# Patient Record
Sex: Male | Born: 1985 | Race: White | Hispanic: No | Marital: Married | Smoking: Current every day smoker
Health system: Southern US, Community
[De-identification: ages and names within clinical notes are randomized; demographics above are authoritative.]

## PROBLEM LIST (undated history)

## (undated) DIAGNOSIS — T7840XA Allergy, unspecified, initial encounter: Secondary | ICD-10-CM

## (undated) DIAGNOSIS — J45909 Unspecified asthma, uncomplicated: Secondary | ICD-10-CM

## (undated) HISTORY — PX: TONSILLECTOMY: SUR1361

## (undated) HISTORY — DX: Unspecified asthma, uncomplicated: J45.909

## (undated) HISTORY — DX: Allergy, unspecified, initial encounter: T78.40XA

---
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
## 2004-04-12 ENCOUNTER — Ambulatory Visit: Payer: Self-pay | Admitting: Psychiatry

## 2004-04-12 ENCOUNTER — Other Ambulatory Visit (HOSPITAL_COMMUNITY): Admission: RE | Admit: 2004-04-12 | Discharge: 2004-07-11 | Payer: Self-pay | Admitting: Psychiatry

## 2007-02-23 ENCOUNTER — Emergency Department (HOSPITAL_COMMUNITY): Admission: EM | Admit: 2007-02-23 | Discharge: 2007-02-23 | Payer: Self-pay | Admitting: Emergency Medicine

## 2015-04-19 ENCOUNTER — Ambulatory Visit (INDEPENDENT_AMBULATORY_CARE_PROVIDER_SITE_OTHER): Payer: Self-pay | Admitting: Physician Assistant

## 2015-04-19 VITALS — BP 118/68 | HR 86 | Temp 98.2°F | Resp 18 | Ht 68.0 in | Wt 168.0 lb

## 2015-04-19 DIAGNOSIS — M546 Pain in thoracic spine: Secondary | ICD-10-CM

## 2015-04-19 DIAGNOSIS — M62830 Muscle spasm of back: Secondary | ICD-10-CM

## 2015-04-19 MED ORDER — MELOXICAM 7.5 MG PO TABS: 7.5000 mg | ORAL_TABLET | Freq: Every day | ORAL | Status: DC

## 2015-04-19 MED ORDER — CYCLOBENZAPRINE HCL 5 MG PO TABS: 5.0000 mg | ORAL_TABLET | Freq: Three times a day (TID) | ORAL | Status: DC | PRN

## 2015-04-19 NOTE — Patient Instructions (Signed)
Please take the flexeril every 8 hours as needed for the spasm. Please take the meloxicam daily for 2-3 weeks. Scheduling tylenol 650 mg every 6 hours will help with the pain.  Heat to the area for the next 24-48 hours then cycling between heat prior to activity and ice after activity will help.  Light massage to the area will help once you're feeling better.   Muscle Cramps and Spasms Muscle cramps and spasms occur when a muscle or muscles tighten and you have no control over this tightening (involuntary muscle contraction). They are a common problem and can develop in any muscle. The most common place is in the calf muscles of the leg. Both muscle cramps and muscle spasms are involuntary muscle contractions, but they also have differences:   Muscle cramps are sporadic and painful. They may last a few seconds to a quarter of an hour. Muscle cramps are often more forceful and last longer than muscle spasms.  Muscle spasms may or may not be painful. They may also last just a few seconds or much longer. CAUSES  It is uncommon for cramps or spasms to be due to a serious underlying problem. In many cases, the cause of cramps or spasms is unknown. Some common causes are:   Overexertion.   Overuse from repetitive motions (doing the same thing over and over).   Remaining in a certain position for a long period of time.   Improper preparation, form, or technique while performing a sport or activity.   Dehydration.   Injury.   Side effects of some medicines.   Abnormally low levels of the salts and ions in your blood (electrolytes), especially potassium and calcium. This could happen if you are taking water pills (diuretics) or you are pregnant.  Some underlying medical problems can make it more likely to develop cramps or spasms. These include, but are not limited to:   Diabetes.   Parkinson disease.   Hormone disorders, such as thyroid problems.   Alcohol abuse.    Diseases specific to muscles, joints, and bones.   Blood vessel disease where not enough blood is getting to the muscles.  HOME CARE INSTRUCTIONS   Stay well hydrated. Drink enough water and fluids to keep your urine clear or pale yellow.  It may be helpful to massage, stretch, and relax the affected muscle.  For tight or tense muscles, use a warm towel, heating pad, or hot shower water directed to the affected area.  If you are sore or have pain after a cramp or spasm, applying ice to the affected area may relieve discomfort.  Put ice in a plastic bag.  Place a towel between your skin and the bag.  Leave the ice on for 15-20 minutes, 03-04 times a day.  Medicines used to treat a known cause of cramps or spasms may help reduce their frequency or severity. Only take over-the-counter or prescription medicines as directed by your caregiver. SEEK MEDICAL CARE IF:  Your cramps or spasms get more severe, more frequent, or do not improve over time.  MAKE SURE YOU:   Understand these instructions.  Will watch your condition.  Will get help right away if you are not doing well or get worse. Document Released: 01/28/2002 Document Revised: 12/03/2012 Document Reviewed: 07/25/2012 [facility] Patient Information 2015 [place], [place]. This information is not intended to replace advice given to you by your health care provider. Make sure you discuss any questions you have with your health care provider.

## 2015-04-19 NOTE — Progress Notes (Signed)
   Subjective:    Patient ID: Lee Clayton, male    DOB: 1986-01-13, 29 y.o.   MRN: [id number]  Chief Complaint  Patient presents with  . Fall    back pain started yesterday    Medications, allergies, past medical history, surgical history, family history, social history and problem list reviewed and updated.  HPI  29 yom presents with left sided mid back pain.   Slipped and fell going down stairs 2 days ago. No back pain after this. Yest morning awoke fine and was working all day fine. Yest mid day leaned to the right to pick something up and felt sudden pain left mid back. Described as sharp. Has been mild and constant since onset, worse with certain movements at the waist. No radiation of pain.   Denies fevers, chills, IVD use, bowel/bladder dysfx, perianal los, leg weakness, numbness, pain, tingling.   Review of Systems See HPI     Objective:   Physical Exam  Constitutional: He appears well-developed and well-nourished.  Non-toxic appearance. He does not have a sickly appearance. He does not appear ill. No distress.  BP 118/68 mmHg  Pulse 86  Temp(Src) 98.2 F (36.8 C) (Oral)  Resp 18  Ht  (1.727 m)  Wt 168 lb (76.204 kg)  BMI 25.55 kg/m2  SpO2 98%   Musculoskeletal:       Thoracic back: He exhibits tenderness and spasm. He exhibits no bony tenderness.       Lumbar back: He exhibits no tenderness and no bony tenderness.       Back:  Pain, spasm over circled area. No overlying skin changes.   Neurological:  Unable to test SLR as pt did not want to lie or sit on bed as he thought he might have trouble getting up.       Assessment & Plan:   Left-sided thoracic back pain - Plan: meloxicam (MOBIC) 7.5 MG tablet  Muscle spasm of back - Plan: cyclobenzaprine (FLEXERIL) 5 MG tablet --no bony tenderness, no compression/cauda equina/abscess signs or sx --suspect spasm/strain left paraspinals -- mobic, flexeril, tylenol, heat, light massage --rtc 4-5 days if no  relief  Donnajean Lopes, PA-C Physician Assistant-Certified [facility]  04/19/2015 11:56 AM

## 2015-04-19 NOTE — Addendum Note (Signed)
Addended by: Eddie Candle on: 04/19/2015 12:02 PM   Modules accepted: Orders

## 2017-05-22 ENCOUNTER — Emergency Department (HOSPITAL_BASED_OUTPATIENT_CLINIC_OR_DEPARTMENT_OTHER)
Admission: EM | Admit: 2017-05-22 | Discharge: 2017-05-22 | Disposition: A | Discharge status: Home / Self Care | Payer: Self-pay | Attending: Emergency Medicine | Admitting: Emergency Medicine

## 2017-05-22 ENCOUNTER — Emergency Department (HOSPITAL_COMMUNITY)
Admission: EM | Admit: 2017-05-22 | Discharge: 2017-05-22 | Discharge status: Against Medical Advice | Payer: Self-pay | Attending: Emergency Medicine | Admitting: Emergency Medicine

## 2017-05-22 ENCOUNTER — Emergency Department (HOSPITAL_BASED_OUTPATIENT_CLINIC_OR_DEPARTMENT_OTHER): Payer: Self-pay

## 2017-05-22 ENCOUNTER — Encounter (HOSPITAL_COMMUNITY): Payer: Self-pay | Admitting: *Deleted

## 2017-05-22 ENCOUNTER — Encounter (HOSPITAL_BASED_OUTPATIENT_CLINIC_OR_DEPARTMENT_OTHER): Payer: Self-pay | Admitting: Emergency Medicine

## 2017-05-22 DIAGNOSIS — R319 Hematuria, unspecified: Secondary | ICD-10-CM | POA: Insufficient documentation

## 2017-05-22 DIAGNOSIS — Z5321 Procedure and treatment not carried out due to patient leaving prior to being seen by health care provider: Secondary | ICD-10-CM | POA: Insufficient documentation

## 2017-05-22 DIAGNOSIS — J45909 Unspecified asthma, uncomplicated: Secondary | ICD-10-CM | POA: Insufficient documentation

## 2017-05-22 DIAGNOSIS — N200 Calculus of kidney: Secondary | ICD-10-CM | POA: Insufficient documentation

## 2017-05-22 DIAGNOSIS — F17210 Nicotine dependence, cigarettes, uncomplicated: Secondary | ICD-10-CM | POA: Insufficient documentation

## 2017-05-22 LAB — URINALYSIS, MICROSCOPIC (REFLEX)

## 2017-05-22 LAB — URINALYSIS, ROUTINE W REFLEX MICROSCOPIC
BILIRUBIN URINE: NEGATIVE
Glucose, UA: NEGATIVE mg/dL
KETONES UR: NEGATIVE mg/dL
Ketones, ur: 15 mg/dL — AB
LEUKOCYTES UA: NEGATIVE
NITRITE: NEGATIVE
Nitrite: NEGATIVE
PROTEIN: NEGATIVE mg/dL
Protein, ur: 100 mg/dL — AB
Specific Gravity, Urine: 1.01 (ref 1.005–1.030)
Specific Gravity, Urine: 1.02 (ref 1.005–1.030)
pH: 8.5 — ABNORMAL HIGH (ref 5.0–8.0)
pH: 7 (ref 5.0–8.0)

## 2017-05-22 MED ORDER — TAMSULOSIN HCL 0.4 MG PO CAPS
0.4000 mg | ORAL_CAPSULE | Freq: Every day | ORAL | 0 refills | Status: AC
Start: 2017-05-22 — End: 2017-06-05

## 2017-05-22 MED ORDER — TRAMADOL HCL 50 MG PO TABS: 50.0000 mg | ORAL_TABLET | Freq: Four times a day (QID) | ORAL | 0 refills | Status: DC | PRN

## 2017-05-22 MED ORDER — IBUPROFEN 800 MG PO TABS: 800.0000 mg | ORAL_TABLET | Freq: Three times a day (TID) | ORAL | 0 refills | Status: DC | PRN

## 2017-05-22 NOTE — ED Triage Notes (Signed)
Patient report that he has acute pain this am to his lower back, reports that he has sat at [facility] long x 6 hours and now the pain is 0/10  - patient reports that his urine stream was less this am with blood in his urine. While sitting at [facility] long he reports that his urine stream got better and he urinated more.

## 2017-05-22 NOTE — ED Notes (Signed)
Patient did not come when called from lobby for vitals to be updated.

## 2017-05-22 NOTE — Discharge Instructions (Signed)
You have been seen in the Emergency Department (ED) today for pain that we believe based on your workup, is caused by kidney stones.  As we have discussed, please drink plenty of fluids.  Please make a follow up appointment with the physician(s) listed elsewhere in this documentation. ° °You may take pain medication as needed but ONLY as prescribed.  Please also take your prescribed Flomax daily.  We also recommend that you take over-the-counter ibuprofen regularly according to label instructions over the next 5 days.  Take it with meals to minimize stomach discomfort. ° °Please see your doctor as soon as possible as stones may take 1-3 weeks to pass and you may require additional care or medications. ° °Do not drink alcohol, drive or participate in any other potentially dangerous activities while taking opiate pain medication as it may make you sleepy. Do not take this medication with any other sedating medications, either prescription or over-the-counter. If you were prescribed Percocet or Vicodin, do not take these with acetaminophen (Tylenol) as it is already contained within these medications. °  °Take Tramadol as needed for severe pain.  This medication is an opiate (or narcotic) pain medication and can be habit forming.  Use it as little as possible to achieve adequate pain control.  Do not use or use it with extreme caution if you have a history of opiate abuse or dependence.  If you are on a pain contract with your primary care doctor or a pain specialist, be sure to let them know you were prescribed this medication today from the Emergency Department.  This medication is intended for your use only - do not give any to anyone else and keep it in a secure place where nobody else, especially children, have access to it.  It will also cause or worsen constipation, so you may want to consider taking an over-the-counter stool softener while you are taking this medication. ° °Return to the Emergency Department  (ED) or call your doctor if you have any worsening pain, fever, painful urination, are unable to urinate, or develop other symptoms that concern you. ° ° ° °Kidney Stones °Kidney stones (urolithiasis) are deposits that form inside your kidneys. The intense pain is caused by the stone moving through the urinary tract. When the stone moves, the ureter goes into spasm around the stone. The stone is usually passed in the urine.  °CAUSES  °A disorder that makes certain neck glands produce too much parathyroid hormone (primary hyperparathyroidism). °A buildup of uric acid crystals, similar to gout in your joints. °Narrowing (stricture) of the ureter. °A kidney obstruction present at birth (congenital obstruction). °Previous surgery on the kidney or ureters. °Numerous kidney infections. °SYMPTOMS  °Feeling sick to your stomach (nauseous). °Throwing up (vomiting). °Blood in the urine (hematuria). °Pain that usually spreads (radiates) to the groin. °Frequency or urgency of urination. °DIAGNOSIS  °Taking a history and physical exam. °Blood or urine tests. °CT scan. °Occasionally, an examination of the inside of the urinary bladder (cystoscopy) is performed. °TREATMENT  °Observation. °Increasing your fluid intake. °Extracorporeal shock wave lithotripsy--This is a noninvasive procedure that uses shock waves to break up kidney stones. °Surgery may be needed if you have severe pain or persistent obstruction. There are various surgical procedures. Most of the procedures are performed with the use of small instruments. Only small incisions are needed to accommodate these instruments, so recovery time is minimized. °The size, location, and chemical composition are all important variables that will determine the proper   choice of action for you. Talk to your health care provider to better understand your situation so that you will minimize the risk of injury to yourself and your kidney.  °HOME CARE INSTRUCTIONS  °Drink enough water  and fluids to keep your urine clear or pale yellow. This will help you to pass the stone or stone fragments. °Strain all urine through the provided strainer. Keep all particulate matter and stones for your health care provider to see. The stone causing the pain may be as small as a grain of salt. It is very important to use the strainer each and every time you pass your urine. The collection of your stone will allow your health care provider to analyze it and verify that a stone has actually passed. The stone analysis will often identify what you can do to reduce the incidence of recurrences. °Only take over-the-counter or prescription medicines for pain, discomfort, or fever as directed by your health care provider. °Keep all follow-up visits as told by your health care provider. This is important. °Get follow-up X-rays if required. The absence of pain does not always mean that the stone has passed. It may have only stopped moving. If the urine remains completely obstructed, it can cause loss of kidney function or even complete destruction of the kidney. It is your responsibility to make sure X-rays and follow-ups are completed. Ultrasounds of the kidney can show blockages and the status of the kidney. Ultrasounds are not associated with any radiation and can be performed easily in a matter of minutes. °Make changes to your daily diet as told by your health care provider. You may be told to: °Limit the amount of salt that you eat. °Eat 5 or more servings of fruits and vegetables each day. °Limit the amount of meat, poultry, fish, and eggs that you eat. °Collect a 24-hour urine sample as told by your health care provider. You may need to collect another urine sample every 6-12 months. °SEEK MEDICAL CARE IF: °You experience pain that is progressive and unresponsive to any pain medicine you have been prescribed. °SEEK IMMEDIATE MEDICAL CARE IF:  °Pain cannot be controlled with the prescribed medicine. °You have a  fever or shaking chills. °The severity or intensity of pain increases over 18 hours and is not relieved by pain medicine. °You develop a new onset of abdominal pain. °You feel faint or pass out. °You are unable to urinate. °  °This information is not intended to replace advice given to you by your health care provider. Make sure you discuss any questions you have with your health care provider. °  °Document Released: 08/08/2005 Document Revised: 04/29/2015 Document Reviewed: 01/09/2013 °Elsevier Interactive Patient Education ©2016 [employer] ° ° ° °

## 2017-05-22 NOTE — ED Provider Notes (Signed)
Emergency Department Provider Note   I have reviewed the triage vital signs and the nursing notes.   HISTORY  Chief Complaint Back Pain   HPI Lee Clayton is a 31 y.o. male with PMH of asthma presents to the emergency department for evaluation of sudden onset right flank and back pain which started at 9 AM this morning. At 9 AM the pain was severe. He took a muscle relaxer provided to him by family which improved pain slightly but when he returned he called EMS and was transported to [facility]. Patient states she waited for proximal 6 hours and was not seen because of the [facility] wait times. He noted some blood in the urine and his pain seemed to be improved. While waiting in the emergency department he did note some occasional radiation to his groin on the right side. He ultimately left without being seen and then presented to this emergency department for evaluation. At this time is having no pain, nausea, vomiting. He denies any weakness or numbness in the legs. No bowel or bladder incontinence. No difficulty walking. Denies any history of kidney stones.   Past Medical History:  Diagnosis Date  . Allergy   . Asthma     There are no active problems to display for this patient.   History reviewed. No pertinent surgical history.  Current Outpatient Rx  . Order #: [id number] Class: Normal  . Order #: [id number] Class: Print  . Order #: [id number] Class: Normal  . Order #: [id number] Class: Print  . Order #: [id number] Class: Print    Allergies Patient has no known allergies.  History reviewed. No pertinent family history.  Social History Social History  Substance Use Topics  . Smoking status: Current Every Day Smoker    Packs/day: 0.25    Types: Cigarettes  . Smokeless tobacco: Never Used  . Alcohol use Not on file    Review of Systems  Constitutional: No fever/chills Eyes: No visual changes. ENT: No sore throat. Cardiovascular: Denies chest pain. Respiratory: Denies  shortness of breath. Gastrointestinal: No abdominal pain.  No nausea, no vomiting.  No diarrhea.  No constipation. Genitourinary: Negative for dysuria. Positive flank pain and hematuria.  Musculoskeletal: Negative for back pain. Skin: Negative for rash. Neurological: Negative for headaches, focal weakness or numbness.  10-point ROS otherwise negative.  ____________________________________________   PHYSICAL EXAM:  VITAL SIGNS: ED Triage Vitals  Enc Vitals Group     BP 05/22/17 1812 121/86     Pulse Rate 05/22/17 1812 88     Resp 05/22/17 1812 18     Temp 05/22/17 1812 98.3 F (36.8 C)     Temp Source 05/22/17 1812 Oral     SpO2 05/22/17 1812 98 %     Weight 05/22/17 1810 168 lb (76.2 kg)     Height 05/22/17 1810  (1.753 m)     Pain Score 05/22/17 1810 0   Constitutional: Alert and oriented. Well appearing and in no acute distress. Eyes: Conjunctivae are normal.  Head: Atraumatic. Nose: No congestion/rhinnorhea. Mouth/Throat: Mucous membranes are moist. Neck: No stridor.   Cardiovascular: Normal rate, regular rhythm. Good peripheral circulation. Grossly normal heart sounds.   Respiratory: Normal respiratory effort.  No retractions. Lungs CTAB. Gastrointestinal: Soft and nontender. No distention.  Musculoskeletal: No lower extremity tenderness nor edema. No gross deformities of extremities. Neurologic:  Normal speech and language. No gross focal neurologic deficits are appreciated.  Skin:  Skin is warm, dry and intact. No rash noted.  ____________________________________________  LABS (all labs ordered are listed, but only abnormal results are displayed)  Labs Reviewed  URINALYSIS, ROUTINE W REFLEX MICROSCOPIC - Abnormal; Notable for the following:       Result Value   Glucose, UA >=500 (*)    Hgb urine dipstick LARGE (*)    All other components within normal limits  URINALYSIS, MICROSCOPIC (REFLEX) - Abnormal; Notable for the following:    Bacteria, UA FEW  (*)    Squamous Epithelial / LPF 0-5 (*)    All other components within normal limits  URINE CULTURE   ____________________________________________  RADIOLOGY  Ct Renal Stone Study  Result Date: 05/22/2017 CLINICAL DATA:  Right lower quadrant pain with vomiting and hematuria today. EXAM: CT ABDOMEN AND PELVIS WITHOUT CONTRAST TECHNIQUE: Multidetector CT imaging of the abdomen and pelvis was performed following the standard protocol without IV contrast. COMPARISON:  None. FINDINGS: Lower chest: Within normal. Hepatobiliary: Within normal. Pancreas: Within normal. Spleen: Within normal. Adrenals/Urinary Tract: Adrenal glands are normal. Kidneys are normal in size and demonstrate a few small nonobstructing left renal stones. 1.7 cm hypodensity over the upper pole cortex of the right kidney with layering calcification and Hounsfield unit measurements of 16 likely a minimally hyperdense cyst. Mild right-sided hydronephrosis and mild dilatation of the right ureter as there is a 3 mm stone over the distal right ureter just above the UVJ. Left ureter is normal. Bladder is within normal. Stomach/Bowel: Stomach, small bowel, appendix and colon are within normal. Vascular/Lymphatic: Within normal. Reproductive: Within normal. Other: Very small left inguinal hernia containing only peritoneal fat. Musculoskeletal: Within normal. IMPRESSION: Left-sided nephrolithiasis. 3 mm stone over the distal right ureter causing low-grade obstruction. 1.7 cm minimally complicated cyst over the upper pole right kidney. Electronically Signed   By: Elberta Fortis M.D.   On: 05/22/2017 19:22    ____________________________________________   PROCEDURES  Procedure(s) performed:   Procedures  None ____________________________________________   INITIAL IMPRESSION / ASSESSMENT AND PLAN / ED COURSE  Pertinent labs & imaging results that were available during my care of the patient were reviewed by me and considered in my  medical decision making (see chart for details).  Patient is a well-appearing adult male who presents with acute onset right flank pain. No pain currently. Urinalysis from outside ED shows hematuria. Clinical suspicion for ureteral stone is high. No evidence on exam or by history to suggest acute spine emergency or other etiology for back pain. Patient with no GU symptoms or testicle pain. As this would be the patient's first kidney stone plan for CT renal protocol and will send urine for urine culture.   CT with 3mm ureteral stone at the right UVJ which correlates with symptoms. Patient with no pain at this time. Discharge home with pain medication, Flomax, NSAIDs, and plan for PCP and Urology follow up. Discussed return precautions for infected stone in detail.   At this time, I do not feel there is any life-threatening condition present. I have reviewed and discussed all results (EKG, imaging, lab, urine as appropriate), exam findings with patient. I have reviewed nursing notes and appropriate previous records.  I feel the patient is safe to be discharged home without further emergent workup. Discussed usual and customary return precautions. Patient and family (if present) verbalize understanding and are comfortable with this plan.  Patient will follow-up with their primary care provider. If they do not have a primary care provider, information for follow-up has been provided to them. All questions have been answered.  ____________________________________________  FINAL CLINICAL IMPRESSION(S) / ED DIAGNOSES  Final diagnoses:  Kidney stone     MEDICATIONS GIVEN DURING THIS VISIT:  Medications - No data to display   NEW OUTPATIENT MEDICATIONS STARTED DURING THIS VISIT:  Discharge Medication List as of 05/22/2017  7:37 PM    START taking these medications   Details  ibuprofen (ADVIL,MOTRIN) 800 MG tablet Take 1 tablet (800 mg total) by mouth every 8 (eight) hours as needed., Starting Mon  05/22/2017, Print    tamsulosin (FLOMAX) 0.4 MG CAPS capsule Take 1 capsule (0.4 mg total) by mouth daily., Starting Mon 05/22/2017, Until Mon 06/05/2017, Print    traMADol (ULTRAM) 50 MG tablet Take 1 tablet (50 mg total) by mouth every 6 (six) hours as needed., Starting Mon 05/22/2017, Print        Note:  This document was prepared using Dragon voice recognition software and may include unintentional dictation errors.  Alona Bene, MD Emergency Medicine    Jeannia Tatro, Arlyss Repress, MD 05/22/17 2312

## 2017-05-22 NOTE — ED Triage Notes (Signed)
Per EMS, pt reports hematuria and lower back pain radiating to his bladder since this morning. Pt refused pain medication en route to hospital. Pt denies fever.

## 2017-05-22 NOTE — ED Notes (Signed)
Pt verbalizes understanding of d/c instructions and denies any further needs at this time. 

## 2017-05-24 LAB — URINE CULTURE
Culture: NO GROWTH
Special Requests: NORMAL

## 2017-06-05 ENCOUNTER — Emergency Department (HOSPITAL_BASED_OUTPATIENT_CLINIC_OR_DEPARTMENT_OTHER)
Admission: EM | Admit: 2017-06-05 | Discharge: 2017-06-06 | Disposition: A | Discharge status: Home / Self Care | Payer: Self-pay | Attending: Emergency Medicine | Admitting: Emergency Medicine

## 2017-06-05 ENCOUNTER — Encounter (HOSPITAL_BASED_OUTPATIENT_CLINIC_OR_DEPARTMENT_OTHER): Payer: Self-pay

## 2017-06-05 DIAGNOSIS — F17210 Nicotine dependence, cigarettes, uncomplicated: Secondary | ICD-10-CM | POA: Insufficient documentation

## 2017-06-05 DIAGNOSIS — Z791 Long term (current) use of non-steroidal anti-inflammatories (NSAID): Secondary | ICD-10-CM | POA: Insufficient documentation

## 2017-06-05 DIAGNOSIS — N200 Calculus of kidney: Secondary | ICD-10-CM | POA: Insufficient documentation

## 2017-06-05 LAB — CBC WITH DIFFERENTIAL/PLATELET
Basophils Absolute: 0.1 10*3/uL (ref 0.0–0.1)
Basophils Relative: 0 %
EOS ABS: 0.2 10*3/uL (ref 0.0–0.7)
Eosinophils Relative: 1 %
HEMATOCRIT: 41.6 % (ref 39.0–52.0)
HEMOGLOBIN: 14.5 g/dL (ref 13.0–17.0)
Lymphocytes Relative: 11 %
Lymphs Abs: 1.8 10*3/uL (ref 0.7–4.0)
MCH: 29.7 pg (ref 26.0–34.0)
MCHC: 34.9 g/dL (ref 30.0–36.0)
MCV: 85.2 fL (ref 78.0–100.0)
MONO ABS: 1.1 10*3/uL — AB (ref 0.1–1.0)
MONOS PCT: 7 %
Neutro Abs: 12.8 10*3/uL — ABNORMAL HIGH (ref 1.7–7.7)
Neutrophils Relative %: 81 %
Platelets: 251 10*3/uL (ref 150–400)
RBC: 4.88 MIL/uL (ref 4.22–5.81)
RDW: 12.6 % (ref 11.5–15.5)
WBC: 15.9 10*3/uL — ABNORMAL HIGH (ref 4.0–10.5)

## 2017-06-05 LAB — URINALYSIS, MICROSCOPIC (REFLEX)

## 2017-06-05 LAB — URINALYSIS, ROUTINE W REFLEX MICROSCOPIC
BILIRUBIN URINE: NEGATIVE
GLUCOSE, UA: NEGATIVE mg/dL
KETONES UR: 15 mg/dL — AB
Leukocytes, UA: NEGATIVE
Nitrite: NEGATIVE
PH: 6 (ref 5.0–8.0)
PROTEIN: NEGATIVE mg/dL
Specific Gravity, Urine: 1.025 (ref 1.005–1.030)

## 2017-06-05 LAB — BASIC METABOLIC PANEL
Anion gap: 9 (ref 5–15)
BUN: 20 mg/dL (ref 6–20)
CALCIUM: 9.3 mg/dL (ref 8.9–10.3)
CO2: 26 mmol/L (ref 22–32)
CREATININE: 1.27 mg/dL — AB (ref 0.61–1.24)
Chloride: 96 mmol/L — ABNORMAL LOW (ref 101–111)
GFR calc Af Amer: >60 mL/min (ref >60)
GFR calc non Af Amer: >60 mL/min (ref >60)
GLUCOSE: 108 mg/dL — AB (ref 65–99)
Potassium: 3.3 mmol/L — ABNORMAL LOW (ref 3.5–5.1)
Sodium: 131 mmol/L — ABNORMAL LOW (ref 135–145)

## 2017-06-05 MED ORDER — MORPHINE SULFATE (PF) 4 MG/ML IV SOLN
4.0000 mg | Freq: Once | INTRAVENOUS | Status: AC
  Administered 2017-06-05: 4 mg via INTRAVENOUS
  Filled 2017-06-05: qty 1

## 2017-06-05 MED ORDER — ONDANSETRON HCL 4 MG/2ML IJ SOLN
4.0000 mg | Freq: Once | INTRAMUSCULAR | Status: AC
  Administered 2017-06-05: 4 mg via INTRAVENOUS
  Filled 2017-06-05: qty 2

## 2017-06-05 MED ORDER — KETOROLAC TROMETHAMINE 30 MG/ML IJ SOLN
30.0000 mg | Freq: Once | INTRAMUSCULAR | Status: AC
  Administered 2017-06-05: 30 mg via INTRAVENOUS
  Filled 2017-06-05: qty 1

## 2017-06-05 NOTE — ED Triage Notes (Signed)
Pt c/o right flank pain that got worse this afternoon with nausea, urinary frequency, not relieved by  ibuprofen and  tramadol

## 2017-06-05 NOTE — ED Provider Notes (Signed)
[facility] EMERGENCY DEPARTMENT Provider Note   CSN: [id number] Arrival date & time: 06/05/17  2307     History   Chief Complaint Chief Complaint  Patient presents with  . Flank Pain    HPI Lee Clayton is a 31 y.o. male.  Patient is a 30 year old male with no significant past history. He presents for evaluation of right-sided flank pain. He was seen 2 weeks ago with similar symptoms and found to have a kidney stone. His pain initially improved, however has worsened once again. He denies any fevers or chills. He does report urinating in small amounts.   The history is provided by the patient.  Flank Pain  This is a new problem. Episode onset: 2 weeks ago. Episode frequency: intermittently. The problem has been rapidly worsening. Pertinent negatives include no abdominal pain. Nothing aggravates the symptoms. Nothing relieves the symptoms. Treatments tried: tramadol. The treatment provided no relief.    Past Medical History:  Diagnosis Date  . Allergy   . Asthma     There are no active problems to display for this patient.   History reviewed. No pertinent surgical history.     Home Medications    Prior to Admission medications   Medication Sig Start Date End Date Taking? Authorizing Provider  cyclobenzaprine (FLEXERIL) 5 MG tablet Take 1 tablet (5 mg total) by mouth 3 (three) times daily as needed for muscle spasms. 04/19/15   McVeigh, Tawanna Cooler, PA  ibuprofen (ADVIL,MOTRIN) 800 MG tablet Take 1 tablet (800 mg total) by mouth every 8 (eight) hours as needed. 05/22/17   Long, Arlyss Repress, MD  meloxicam (MOBIC) 7.5 MG tablet Take 1 tablet (7.5 mg total) by mouth daily. 04/19/15   McVeigh, Tawanna Cooler, PA  tamsulosin (FLOMAX) 0.4 MG CAPS capsule Take 1 capsule (0.4 mg total) by mouth daily. 05/22/17 06/05/17  Long, Arlyss Repress, MD  traMADol (ULTRAM) 50 MG tablet Take 1 tablet (50 mg total) by mouth every 6 (six) hours as needed. 05/22/17   Long, Arlyss Repress, MD    Family  History No family history on file.  Social History Social History  Substance Use Topics  . Smoking status: Current Every Day Smoker    Packs/day: 0.25    Types: Cigarettes  . Smokeless tobacco: Never Used  . Alcohol use Not on file     Allergies   Patient has no known allergies.   Review of Systems Review of Systems  Gastrointestinal: Negative for abdominal pain.  Genitourinary: Positive for flank pain.  All other systems reviewed and are negative.    Physical Exam Updated Vital Signs BP (!) 134/98 (BP Location: Left Arm)   Pulse 70   Temp 97.8 F (36.6 C) (Oral)   Resp 18   Ht  (1.753 m)   Wt 79.4 kg (175 lb)   SpO2 100%   BMI 25.84 kg/m   Physical Exam  Constitutional: He is oriented to person, place, and time. He appears well-developed and well-nourished. No distress.  HENT:  Head: Normocephalic and atraumatic.  Mouth/Throat: Oropharynx is clear and moist.  Neck: Normal range of motion. Neck supple.  Cardiovascular: Normal rate and regular rhythm.  Exam reveals no friction rub.   No murmur heard. Pulmonary/Chest: Effort normal and breath sounds normal. No respiratory distress. He has no wheezes. He has no rales.  Abdominal: Soft. Bowel sounds are normal. He exhibits no distension. There is tenderness. There is no rebound and no guarding.  Here is tenderness to palpation  in the right flank.  Musculoskeletal: Normal range of motion. He exhibits no edema.  Neurological: He is alert and oriented to person, place, and time. Coordination normal.  Skin: Skin is warm and dry. He is not diaphoretic.  Nursing note and vitals reviewed.    ED Treatments / Results  Labs (all labs ordered are listed, but only abnormal results are displayed) Labs Reviewed  URINALYSIS, ROUTINE W REFLEX MICROSCOPIC - Abnormal; Notable for the following:       Result Value   APPearance CLOUDY (*)    Hgb urine dipstick LARGE (*)    Ketones, ur 15 (*)    All other components  within normal limits  URINALYSIS, MICROSCOPIC (REFLEX) - Abnormal; Notable for the following:    Bacteria, UA RARE (*)    Squamous Epithelial / LPF 0-5 (*)    All other components within normal limits  BASIC METABOLIC PANEL  CBC WITH DIFFERENTIAL/PLATELET    EKG  EKG Interpretation None       Radiology No results found.  Procedures Procedures (including critical care time)  Medications Ordered in ED Medications  morphine 4 MG/ML injection 4 mg (not administered)  ondansetron (ZOFRAN) injection 4 mg (not administered)  ketorolac (TORADOL) 30 MG/ML injection 30 mg (not administered)     Initial Impression / Assessment and Plan / ED Course  I have reviewed the triage vital signs and the nursing notes.  Pertinent labs & imaging results that were available during my care of the patient were reviewed by me and considered in my medical decision making (see chart for details).  Patient diagnosed with renal calculus 2 weeks ago. He is having ongoing pain. He does not appear to be infected and is feeling better with medications given in the ER. He will be discharged with a stronger pain pill and what was previously prescribed and follow-up with urology if he is not improving in the next several days.  Final Clinical Impressions(s) / ED Diagnoses   Final diagnoses:  None    New Prescriptions New Prescriptions   No medications on file     Geoffery Lyons, MD 06/06/17 [phone]

## 2017-06-06 MED ORDER — OXYCODONE-ACETAMINOPHEN 5-325 MG PO TABS: 1.0000 | ORAL_TABLET | Freq: Four times a day (QID) | ORAL | 0 refills | Status: DC | PRN

## 2017-06-06 NOTE — ED Notes (Signed)
Pt verbalizes understanding of d/c instructions and denies any further needs at this time. 

## 2017-06-06 NOTE — Discharge Instructions (Signed)
Percocet as prescribed as needed for pain.  Follow-up with [facility] if your symptoms are not improving in the next 3 days. Their contact information has been provided in this discharge summary for you to call and make these arrangements.  Return to the emergency department in the meantime if you develop worsening pain, high fevers, or other new and concerning symptoms.

## 2018-04-20 IMAGING — CT CT RENAL STONE PROTOCOL
2 of 4 series · 16 of 46 positions shown, 18 images · non-contrast
Comparison: None.

CLINICAL DATA: Right lower quadrant pain with vomiting and
hematuria today.

EXAM:
CT ABDOMEN AND PELVIS WITHOUT CONTRAST
TECHNIQUE: Multidetector CT imaging of the abdomen and pelvis was performed
following the standard protocol without IV contrast.

[Series 2: axial st · axial · 0.85mm/px · z∈[-598,-138]mm · 13 of 102 slices shown, 15 images]
[im 5/102  soft-tissue]
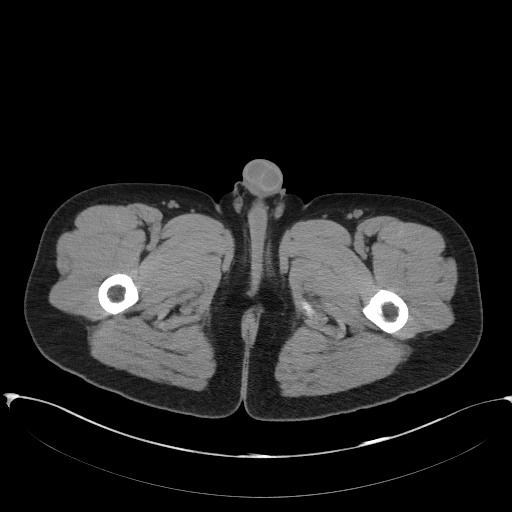
[im 5/102  bone]
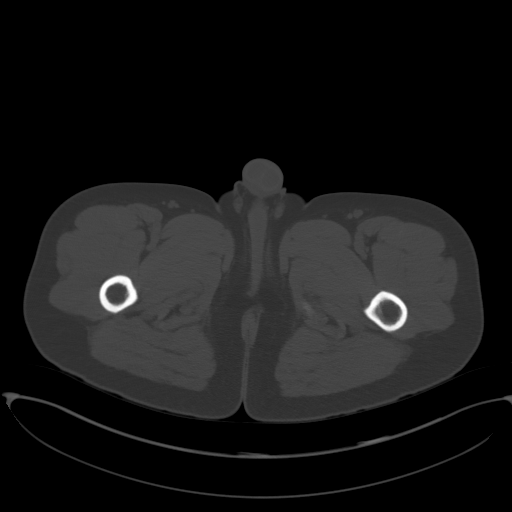
[im 14/102  soft-tissue]
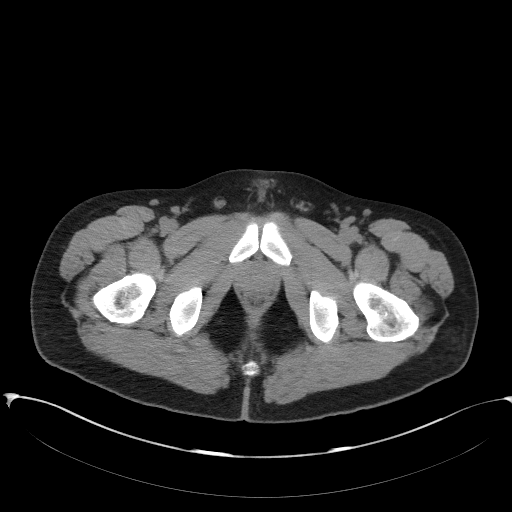
[im 22/102  soft-tissue]
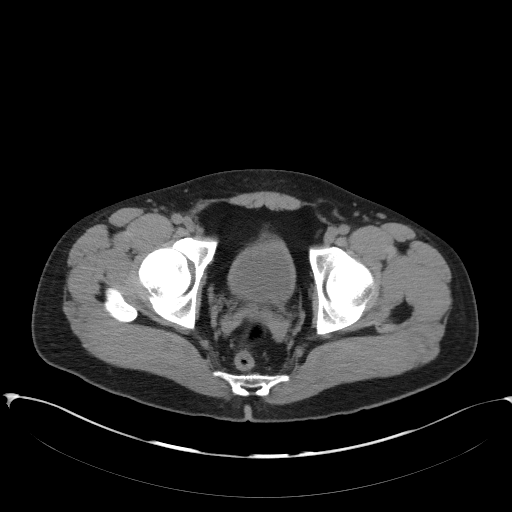
[im 27/102  soft-tissue]
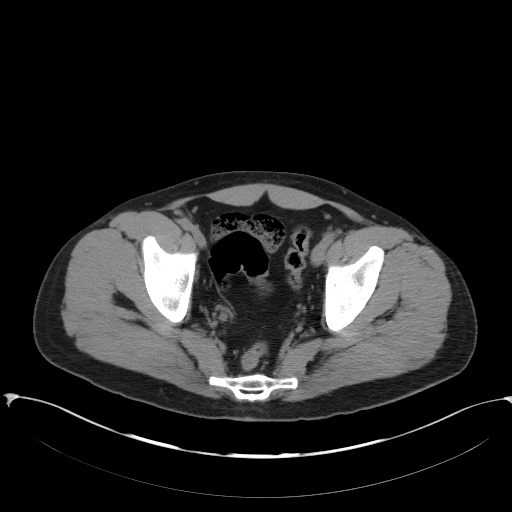
[im 36/102  soft-tissue]
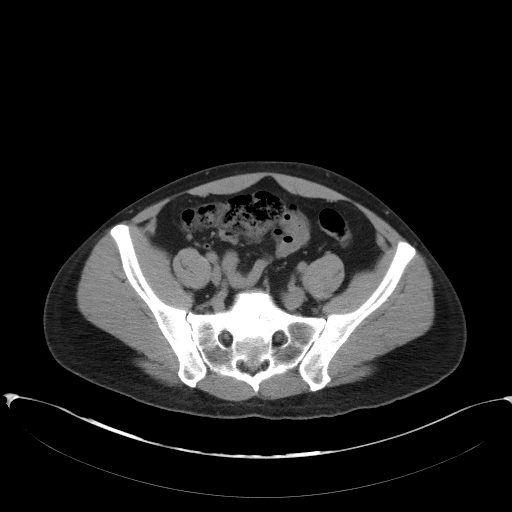
[im 44/102  soft-tissue]
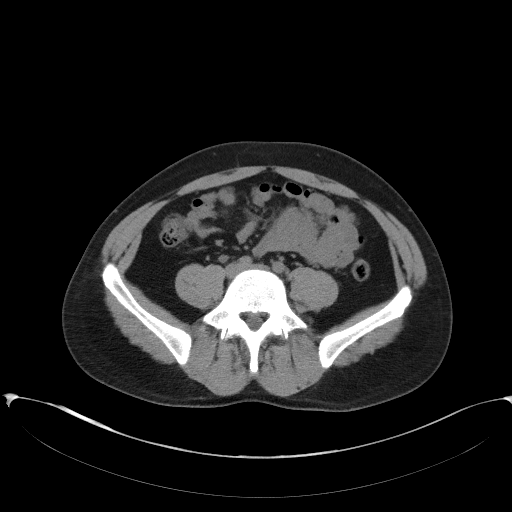
[im 53/102  soft-tissue]
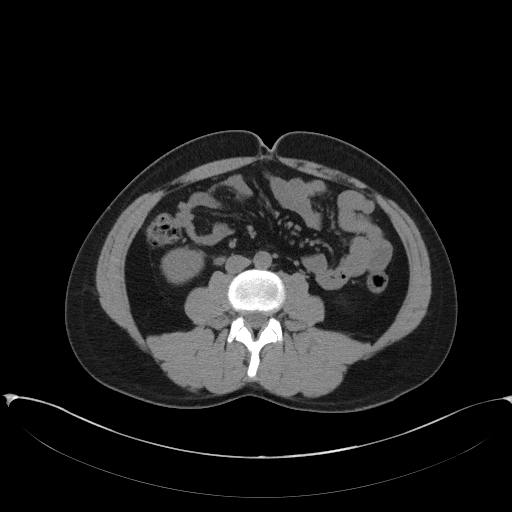
[im 58/102  soft-tissue]
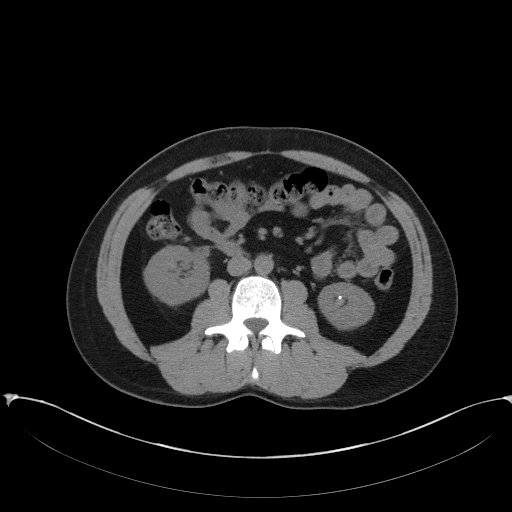
[im 66/102  soft-tissue]
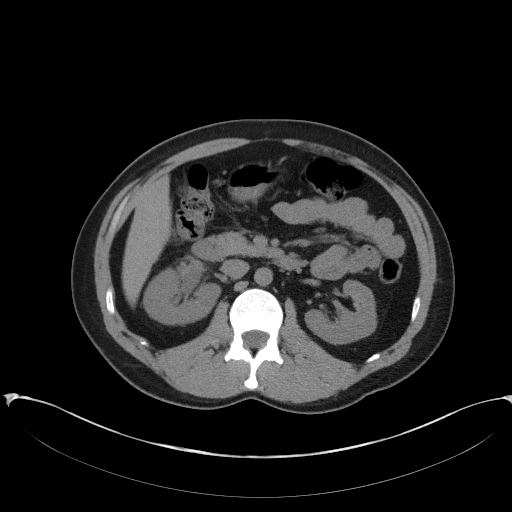
[im 66/102  bone]
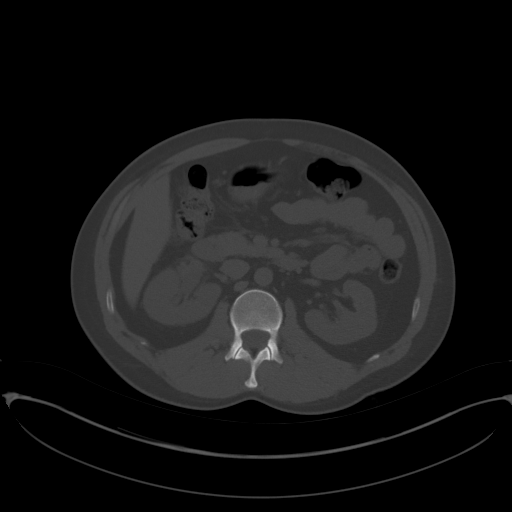
[im 75/102  soft-tissue]
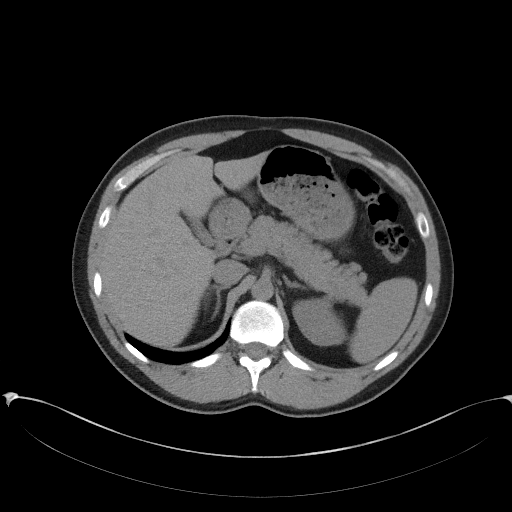
[im 80/102  soft-tissue]
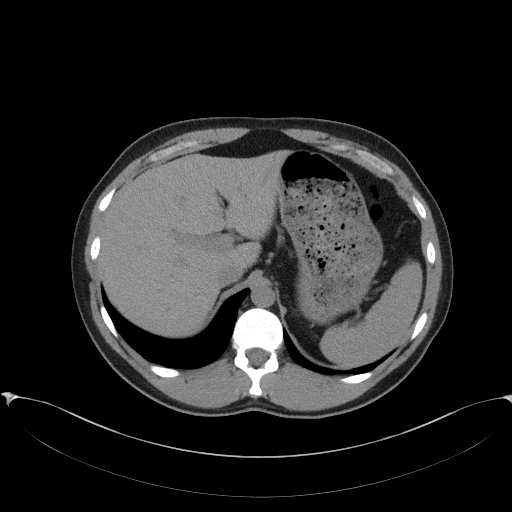
[im 88/102  soft-tissue]
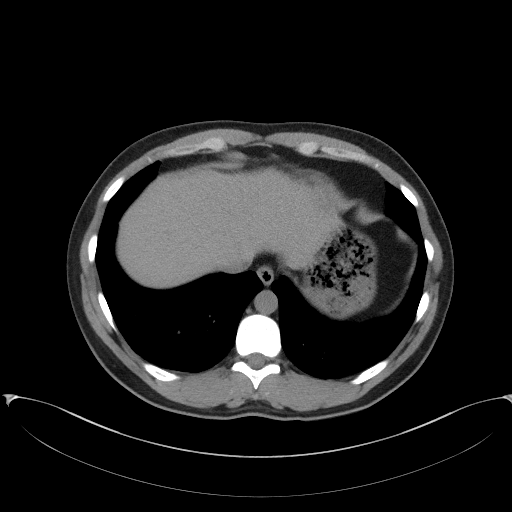
[im 97/102  soft-tissue]
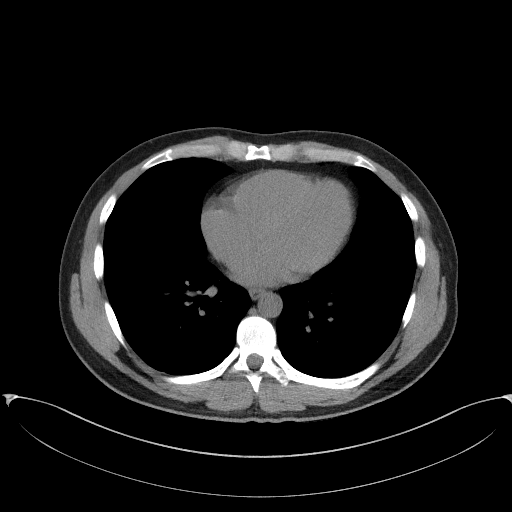

[Series 5: coronal st · coronal · 0.82mm/px · 3 of 85 slices shown]
[im 29/85  soft-tissue]
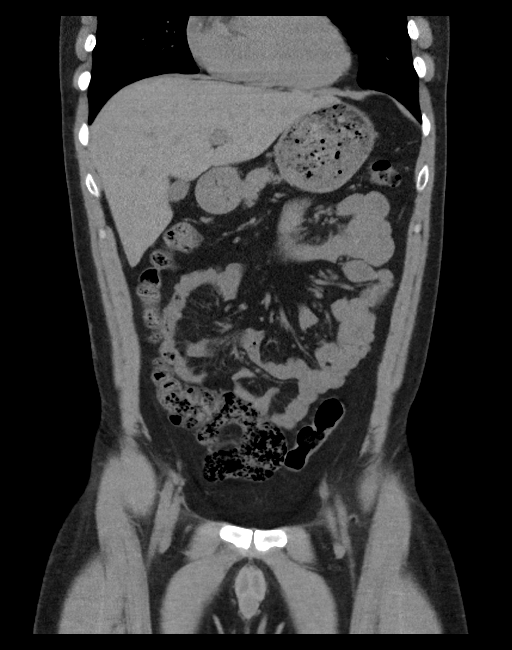
[im 38/85  soft-tissue]
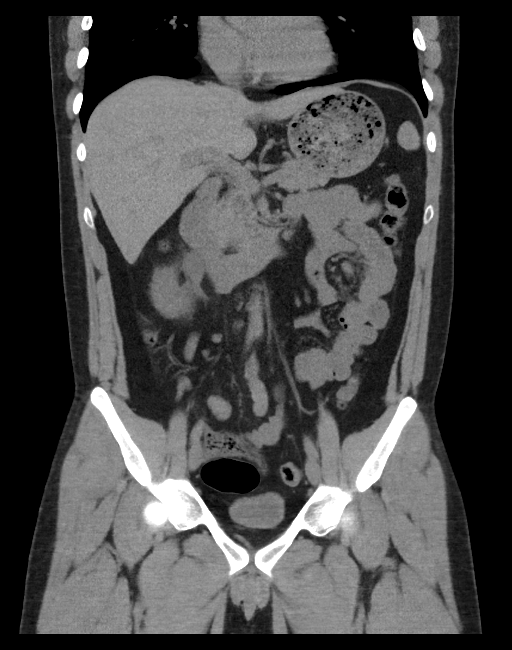
[im 47/85  soft-tissue]
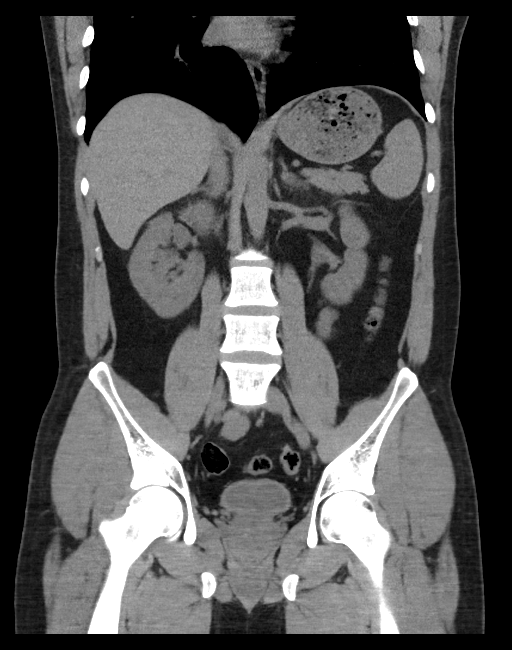

[16 of 46 positions shown; findings below may reference images not displayed]

FINDINGS: Lower chest: Within normal.

Hepatobiliary: Within normal.

Pancreas: Within normal.

Spleen: Within normal.

Adrenals/Urinary Tract: Adrenal glands are normal. Kidneys are
normal in size and demonstrate a few small nonobstructing left renal
stones. 1.7 cm hypodensity over the upper pole cortex of the right
kidney with layering calcification and Hounsfield unit measurements
of 16 likely a minimally hyperdense cyst. Mild right-sided
hydronephrosis and mild dilatation of the right ureter as there is a
3 mm stone over the distal right ureter just above the UVJ. Left
ureter is normal. Bladder is within normal.

Stomach/Bowel: Stomach, small bowel, appendix and colon are within
normal.

Vascular/Lymphatic: Within normal.

Reproductive: Within normal.

Other: Very small left inguinal hernia containing only peritoneal
fat.

Musculoskeletal: Within normal.
IMPRESSION: Left-sided nephrolithiasis. 3 mm stone over the distal right ureter
causing low-grade obstruction.

1.7 cm minimally complicated cyst over the upper pole right kidney.

## 2018-09-08 ENCOUNTER — Ambulatory Visit: Payer: Self-pay

## 2018-09-08 ENCOUNTER — Ambulatory Visit
Admission: EM | Admit: 2018-09-08 | Discharge: 2018-09-08 | Disposition: A | Discharge status: Home / Self Care | Payer: Self-pay | Attending: Emergency Medicine | Admitting: Emergency Medicine

## 2018-09-08 ENCOUNTER — Encounter: Payer: Self-pay | Admitting: Emergency Medicine

## 2018-09-08 DIAGNOSIS — S60221A Contusion of right hand, initial encounter: Secondary | ICD-10-CM | POA: Insufficient documentation

## 2018-09-08 MED ORDER — IBUPROFEN 600 MG PO TABS: 600.0000 mg | ORAL_TABLET | Freq: Four times a day (QID) | ORAL | 0 refills | Status: DC | PRN

## 2018-09-08 NOTE — Discharge Instructions (Signed)
No fracture  Use anti-inflammatories for pain/swelling. You may take up to 800 mg Ibuprofen every 8 hours with food. You may supplement Ibuprofen with Tylenol [phone] mg every 8 hours.   Ice   Ace wrap

## 2018-09-08 NOTE — ED Provider Notes (Signed)
[facility]    CSN: [id number] Arrival date & time: 09/08/18  1116     History   Chief Complaint Chief Complaint  Patient presents with  . Hand Pain    HPI Lee Clayton is a 33 y.o. male history of asthma presenting today for evaluation of right hand pain.  Yesterday patient punched a door.  Since he has had pain to his right hand near the base of his fourth and fifth fingers.  He states that he felt a popping sensation with moving his fingers and is concerned about this not healing properly.  Denies numbness or tingling.  Denies difficulty moving fingers but pain is mainly in hand.  HPI  Past Medical History:  Diagnosis Date  . Allergy   . Asthma     There are no active problems to display for this patient.   History reviewed. No pertinent surgical history.     Home Medications    Prior to Admission medications   Medication Sig Start Date End Date Taking? Authorizing Provider  ibuprofen (ADVIL,MOTRIN) 600 MG tablet Take 1 tablet (600 mg total) by mouth every 6 (six) hours as needed. 09/08/18   Wieters, Junius CreamerHallie C, PA-C    Family History History reviewed. No pertinent family history.  Social History Social History   Tobacco Use  . Smoking status: Current Every Day Smoker    Packs/day: 1.00    Types: Cigarettes  . Smokeless tobacco: Never Used  Substance Use Topics  . Alcohol use: Not on file  . Drug use: Not on file     Allergies   Patient has no known allergies.   Review of Systems Review of Systems  Constitutional: Negative for fatigue and fever.  Eyes: Negative for redness, itching and visual disturbance.  Respiratory: Negative for shortness of breath.   Cardiovascular: Negative for chest pain and leg swelling.  Gastrointestinal: Negative for nausea and vomiting.  Musculoskeletal: Positive for arthralgias, joint swelling and myalgias.  Skin: Positive for color change and wound. Negative for rash.  Neurological: Negative for  dizziness, syncope, weakness, light-headedness and headaches.     Physical Exam Triage Vital Signs ED Triage Vitals  Enc Vitals Group     BP 09/08/18 1126 (!) 155/109     Pulse Rate 09/08/18 1126 90     Resp 09/08/18 1126 18     Temp 09/08/18 1126 97.8 F (36.6 C)     Temp Source 09/08/18 1126 Oral     SpO2 09/08/18 1126 95 %     Weight --      Height --      Head Circumference --      Peak Flow --      Pain Score 09/08/18 1129 6     Pain Loc --      Pain Edu? --      Excl. in GC? --    No data found.  Updated Vital Signs BP (!) 155/109 (BP Location: Left Arm)   Pulse 90   Temp 97.8 F (36.6 C) (Oral)   Resp 18   SpO2 95%   Visual Acuity Right Eye Distance:   Left Eye Distance:   Bilateral Distance:    Right Eye Near:   Left Eye Near:    Bilateral Near:     Physical Exam Vitals signs and nursing note reviewed.  Constitutional:      Appearance: He is well-developed.     Comments: No acute distress  HENT:  Head: Normocephalic and atraumatic.     Nose: Nose normal.  Eyes:     Conjunctiva/sclera: Conjunctivae normal.  Neck:     Musculoskeletal: Neck supple.  Cardiovascular:     Rate and Rhythm: Normal rate.  Pulmonary:     Effort: Pulmonary effort is normal. No respiratory distress.  Abdominal:     General: There is no distension.  Musculoskeletal: Normal range of motion.     Comments: Right hand with swelling and tenderness to palpation of distal fourth and fifth metacarpals, full active range of motion of fourth and fifth fingers, superficial abrasions to distal metacarpals overlying right hand on dorsum, cap refill less than 2 seconds, radial pulse 2+  Skin:    General: Skin is warm and dry.  Neurological:     Mental Status: He is alert and oriented to person, place, and time.      UC Treatments / Results  Labs (all labs ordered are listed, but only abnormal results are displayed) Labs Reviewed - No data to  display  EKG None  Radiology Dg Hand Complete Right  Result Date: 09/08/2018 CLINICAL DATA:  Punching injury, metacarpal pain EXAM: RIGHT HAND - COMPLETE 3+ VIEW COMPARISON:  None. FINDINGS: There is no evidence of fracture or dislocation. There is no evidence of arthropathy or other focal bone abnormality. Soft tissues are unremarkable. IMPRESSION: Negative. Electronically Signed   By: Judie PetitM.  Shick M.D.   On: 09/08/2018 11:50    Procedures Procedures (including critical care time)  Medications Ordered in UC Medications - No data to display  Initial Impression / Assessment and Plan / UC Course  I have reviewed the triage vital signs and the nursing notes.  Pertinent labs & imaging results that were available during my care of the patient were reviewed by me and considered in my medical decision making (see chart for details).     X-ray negative for fracture.  Most likely hand contusion.  Will recommend anti-inflammatories, ice, Ace wrap.  Monitor abrasions for development of infection.  Discussed wound care.Discussed strict return precautions. Patient verbalized understanding and is agreeable with plan.  Final Clinical Impressions(s) / UC Diagnoses   Final diagnoses:  Contusion of right hand, initial encounter     Discharge Instructions     No fracture  Use anti-inflammatories for pain/swelling. You may take up to 800 mg Ibuprofen every 8 hours with food. You may supplement Ibuprofen with Tylenol [phone] mg every 8 hours.   Ice   Ace wrap    ED Prescriptions    Medication Sig Dispense Auth. Provider   ibuprofen (ADVIL,MOTRIN) 600 MG tablet Take 1 tablet (600 mg total) by mouth every 6 (six) hours as needed. 30 tablet Wieters, AuroraHallie C, PA-C     Controlled Substance Prescriptions [place] Controlled Substance Registry consulted? Not Applicable   Lew DawesWieters, Hallie C, [place]PA-C 09/08/18 1206

## 2018-09-08 NOTE — ED Notes (Signed)
Patient able to ambulate independently  

## 2018-09-08 NOTE — ED Triage Notes (Signed)
Pt presents to [facility] for assessment after punching a door yesterday for continued pain to right hand.

## 2019-03-22 ENCOUNTER — Ambulatory Visit
Admission: EM | Admit: 2019-03-22 | Discharge: 2019-03-22 | Disposition: A | Discharge status: Home / Self Care | Payer: Self-pay | Attending: Emergency Medicine | Admitting: Emergency Medicine

## 2019-03-22 DIAGNOSIS — S39012A Strain of muscle, fascia and tendon of lower back, initial encounter: Secondary | ICD-10-CM

## 2019-03-22 DIAGNOSIS — X500XXA Overexertion from strenuous movement or load, initial encounter: Secondary | ICD-10-CM

## 2019-03-22 MED ORDER — MELOXICAM 7.5 MG PO TABS: 7.5000 mg | ORAL_TABLET | Freq: Every day | ORAL | 0 refills | Status: DC

## 2019-03-22 MED ORDER — CYCLOBENZAPRINE HCL 5 MG PO TABS: 5.0000 mg | ORAL_TABLET | Freq: Three times a day (TID) | ORAL | 0 refills | Status: DC | PRN

## 2019-03-22 NOTE — Discharge Instructions (Addendum)
Light and regular activity as tolerated.  See exercises provided.  Heat application while active can help with muscle spasms.  Meloxicam daily. Take with food. Don't take additional ibuprofen or aleve, may take additional tylenol/acetaminophen if needed.  Muscle relaxer as needed. Primarily at night before bed. May cause drowsiness. Please do not take if driving or drinking alcohol.    May follow up with sports medicine as needed for persistent symptoms as may need further evaluation or treatment.

## 2019-03-22 NOTE — ED Provider Notes (Signed)
[facility]    CSN: [id number] Arrival date & time: 03/22/19  1010      History   Chief Complaint Chief Complaint  Patient presents with  . Back Pain    HPI Lee Clayton is a 33 y.o. male.   Lee Clayton presents with complaints of left sided back pain after lifting wood beams at work yesterday. He works in [employer]. He feels like he twisted somewhat when he lifted which triggered the injury. Has had similar in the past, to the same area, but has been a few years. Took ibuprofen which has helped. Took at approximately 8:15 this morning. When he woke this morning he felt much worse than he did yesterday or now. No numbness tingling or weakness to extremities. No loss of bladder or bowel. Pain doesn't radiate. Pain is worse with certain movements such as moving his left arm, or twisting his trunk. Doesn't follow with a PCP. Hx of asthma.    ROS per HPI, negative if not otherwise mentioned.      Past Medical History:  Diagnosis Date  . Allergy   . Asthma     There are no active problems to display for this patient.   History reviewed. No pertinent surgical history.     Home Medications    Prior to Admission medications   Medication Sig Start Date End Date Taking? Authorizing Provider  cyclobenzaprine (FLEXERIL) 5 MG tablet Take 1 tablet (5 mg total) by mouth 3 (three) times daily as needed for muscle spasms. 03/22/19   Georgetta HaberBurky,  B, NP  ibuprofen (ADVIL,MOTRIN) 600 MG tablet Take 1 tablet (600 mg total) by mouth every 6 (six) hours as needed. 09/08/18   Wieters, Hallie C, PA-C  meloxicam (MOBIC) 7.5 MG tablet Take 1 tablet (7.5 mg total) by mouth daily. 03/22/19   Georgetta HaberBurky,  B, NP    Family History No family history on file.  Social History Social History   Tobacco Use  . Smoking status: Current Every Day Smoker    Packs/day: 1.00    Types: Cigarettes  . Smokeless tobacco: Never Used  Substance Use Topics  . Alcohol use: Not  Currently    Alcohol/week: 0.0 standard drinks  . Drug use: Not on file     Allergies   Patient has no known allergies.   Review of Systems Review of Systems   Physical Exam Triage Vital Signs ED Triage Vitals  Enc Vitals Group     BP 03/22/19 1028 121/81     Pulse Rate 03/22/19 1028 75     Resp 03/22/19 1028 18     Temp 03/22/19 1028 98.4 F (36.9 C)     Temp Source 03/22/19 1028 Oral     SpO2 03/22/19 1028 96 %     Weight --      Height --      Head Circumference --      Peak Flow --      Pain Score 03/22/19 1029 6     Pain Loc --      Pain Edu? --      Excl. in GC? --    No data found.  Updated Vital Signs BP 121/81 (BP Location: Left Arm)   Pulse 75   Temp 98.4 F (36.9 C) (Oral)   Resp 18   SpO2 96%    Physical Exam Constitutional:      Appearance: He is well-developed.  Cardiovascular:     Rate and Rhythm: Normal  rate.  Pulmonary:     Effort: Pulmonary effort is normal.     Breath sounds: Normal breath sounds.  Musculoskeletal:     Lumbar back: He exhibits tenderness and pain. He exhibits normal range of motion, no bony tenderness, no swelling, no edema, no deformity, no laceration, no spasm and normal pulse.       Back:     Comments: no step off or deformity to spinous processes;  strength equal bilaterally; gross sensation intact to upper and lower extremities; pain with transition from sit to lay and lay to sit, pain with rotation of trunk and with movement of left arm   Skin:    General: Skin is warm and dry.  Neurological:     Mental Status: He is alert and oriented to person, place, and time.      UC Treatments / Results  Labs (all labs ordered are listed, but only abnormal results are displayed) Labs Reviewed - No data to display  EKG   Radiology No results found.  Procedures Procedures (including critical care time)  Medications Ordered in UC Medications - No data to display  Initial Impression / Assessment and Plan / UC  Course  I have reviewed the triage vital signs and the nursing notes.  Pertinent labs & imaging results that were available during my care of the patient were reviewed by me and considered in my medical decision making (see chart for details).     Left mid/low back strain. Pain management discussed. Return precautions provided. Patient verbalized understanding and agreeable to plan.  Ambulatory out of clinic without difficulty.    Final Clinical Impressions(s) / UC Diagnoses   Final diagnoses:  Strain of lumbar region, initial encounter     Discharge Instructions     Light and regular activity as tolerated.  See exercises provided.  Heat application while active can help with muscle spasms.  Meloxicam daily. Take with food. Don't take additional ibuprofen or aleve, may take additional tylenol/acetaminophen if needed.  Muscle relaxer as needed. Primarily at night before bed. May cause drowsiness. Please do not take if driving or drinking alcohol.    May follow up with sports medicine as needed for persistent symptoms as may need further evaluation or treatment.    ED Prescriptions    Medication Sig Dispense Auth. Provider   meloxicam (MOBIC) 7.5 MG tablet Take 1 tablet (7.5 mg total) by mouth daily. 20 tablet Augusto Gamble B, NP   cyclobenzaprine (FLEXERIL) 5 MG tablet Take 1 tablet (5 mg total) by mouth 3 (three) times daily as needed for muscle spasms. 21 tablet Zigmund Gottron, NP     Controlled Substance Prescriptions [place] Controlled Substance Registry consulted? Not Applicable   Zigmund Gottron, NP 03/22/19 1115

## 2019-03-22 NOTE — ED Triage Notes (Signed)
Pt states pulled a muscle in his back after lifting something yesterday. State pain to lt center back.

## 2019-08-07 IMAGING — DX DG HAND COMPLETE 3+V*R*
3 series · 3 of 3 positions shown · non-contrast
Comparison: None.

CLINICAL DATA: Punching injury, metacarpal pain

EXAM:
RIGHT HAND - COMPLETE 3+ VIEW

[hand pa]
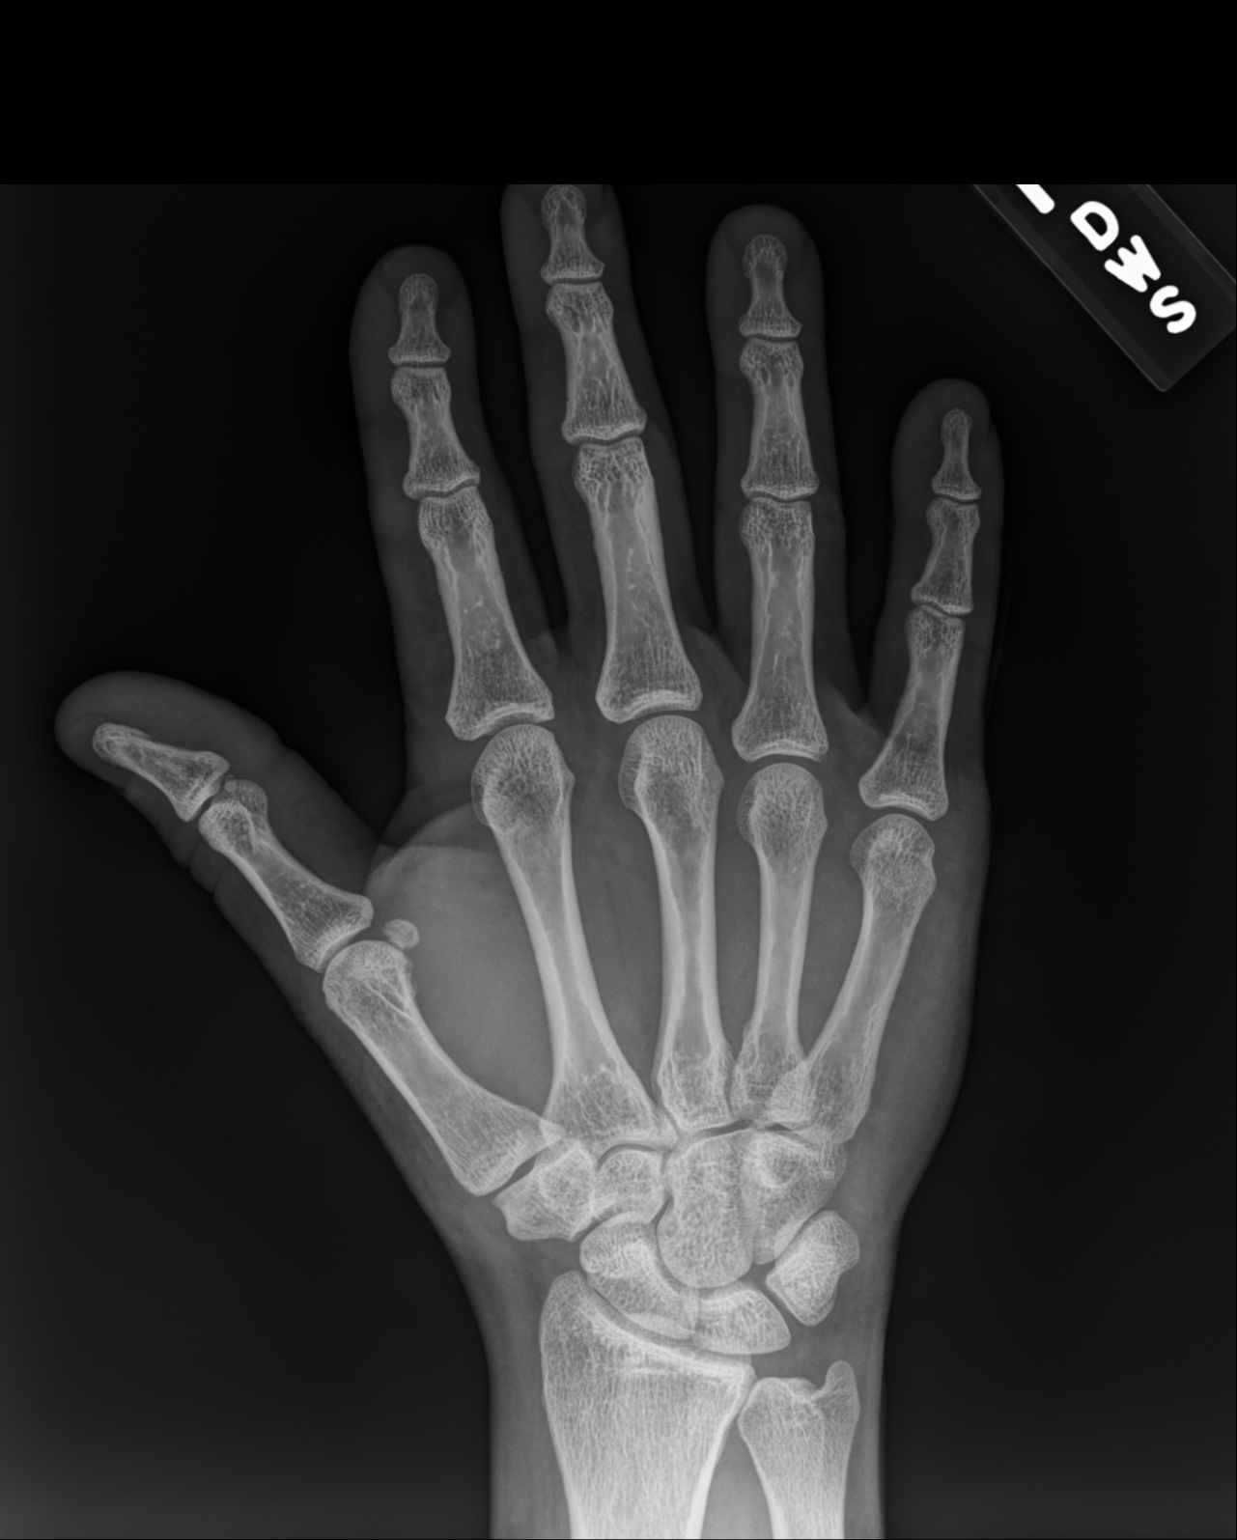

[hand mlo]
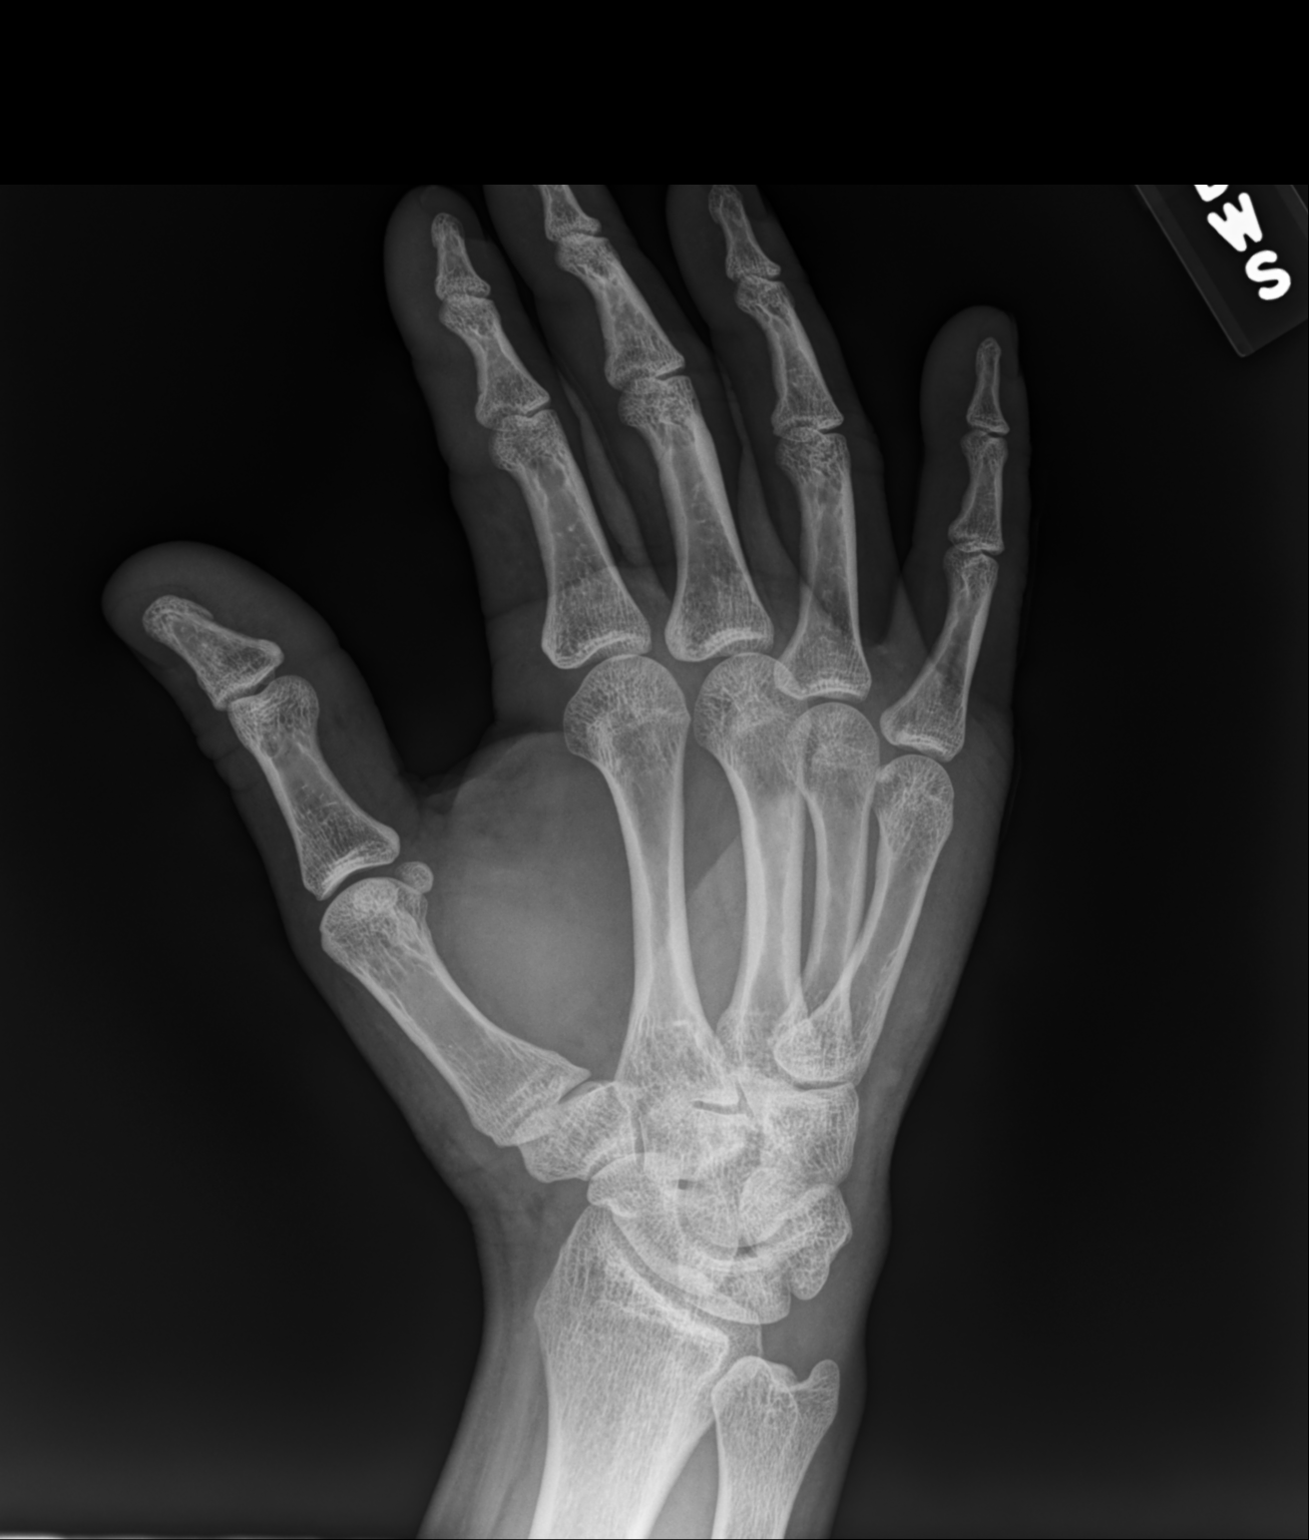

[hand lat]
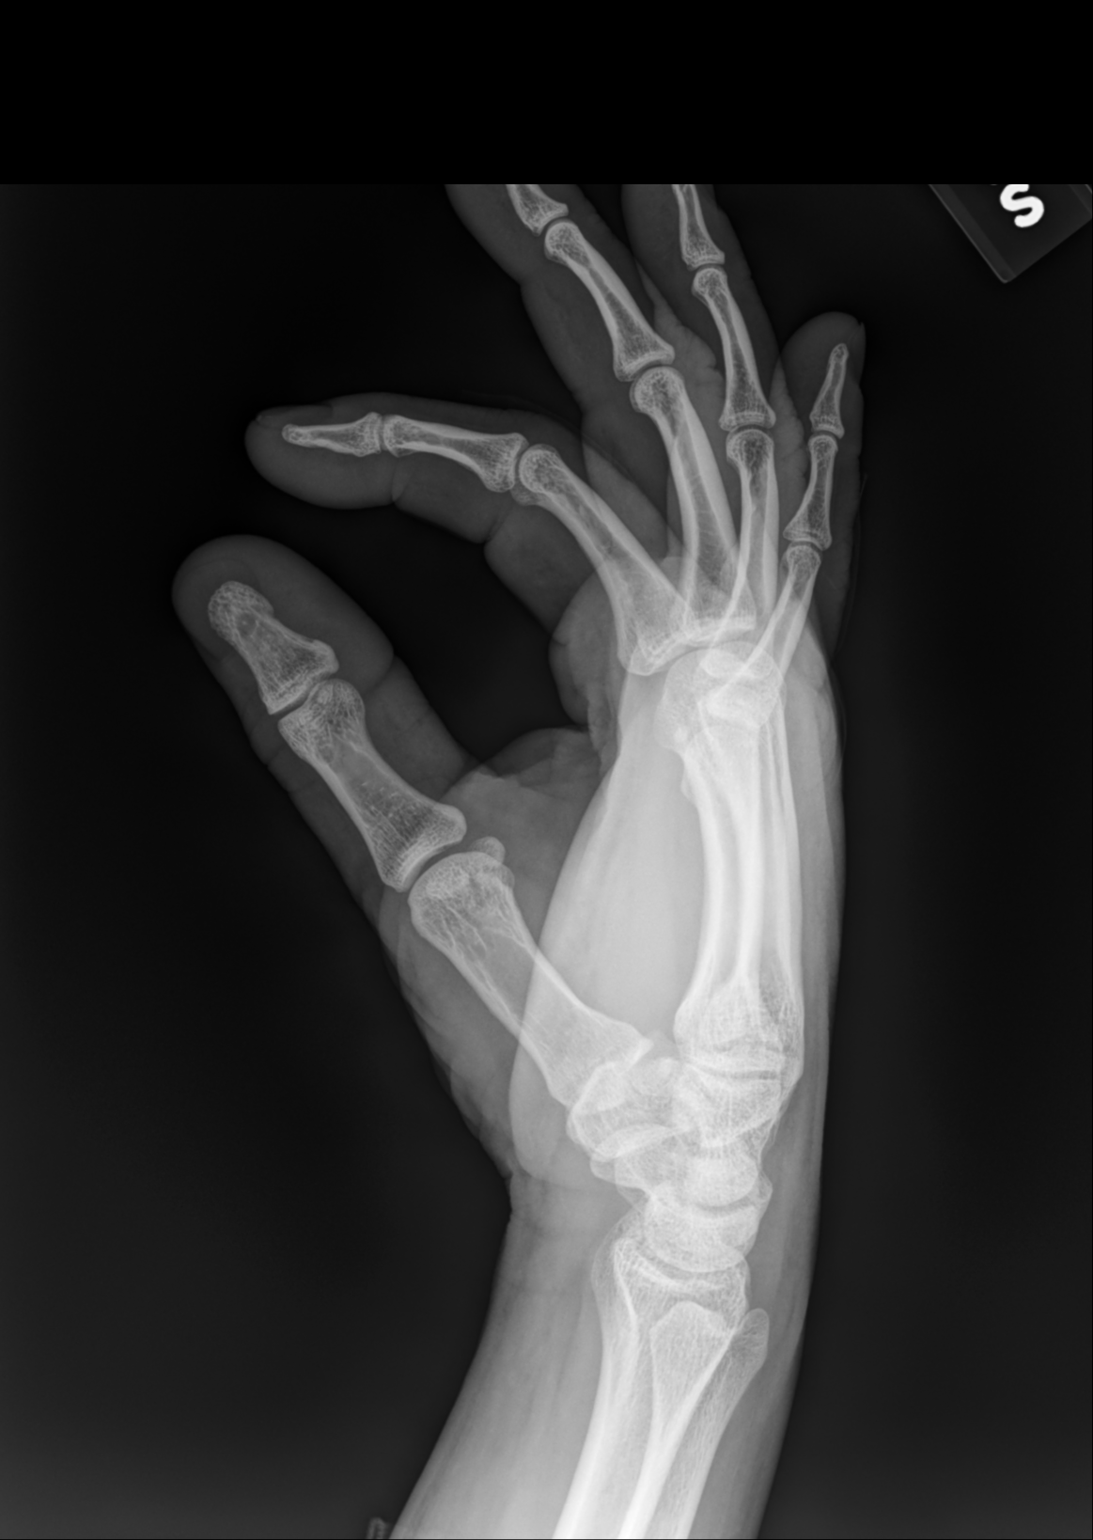

[3 of 3 positions shown; findings below may reference images not displayed]

FINDINGS: There is no evidence of fracture or dislocation. There is no
evidence of arthropathy or other focal bone abnormality. Soft
tissues are unremarkable.
IMPRESSION: Negative.

## 2020-02-28 ENCOUNTER — Ambulatory Visit
Admission: EM | Admit: 2020-02-28 | Discharge: 2020-02-28 | Disposition: A | Discharge status: Home / Self Care | Payer: Self-pay | Attending: Physician Assistant | Admitting: Physician Assistant

## 2020-02-28 ENCOUNTER — Ambulatory Visit: Admission: EM | Admit: 2020-02-28 | Discharge: 2020-02-28 | Discharge status: Absent without leave | Payer: Self-pay

## 2020-02-28 ENCOUNTER — Other Ambulatory Visit: Payer: Self-pay

## 2020-02-28 DIAGNOSIS — S0501XA Injury of conjunctiva and corneal abrasion without foreign body, right eye, initial encounter: Secondary | ICD-10-CM

## 2020-02-28 MED ORDER — OFLOXACIN 0.3 % OP SOLN: OPHTHALMIC | 0 refills | Status: AC

## 2020-02-28 NOTE — Discharge Instructions (Signed)
Start ofloxacin as directed.  Artificial tear gel (systane, genteal) at night. Wait 10-15 minutes between drops, always use artificial tear gel last, as it prevents drops from penetrating through. Monitor for any worsening of symptoms, changes in vision, sensitivity to light, eye swelling, painful eye movement, follow up with ophthalmology for further evaluation. Otherwise follow up with ophthalmology on Monday for recheck.

## 2020-02-28 NOTE — ED Triage Notes (Signed)
Pt c/o right eye irritation after using hammer to hit a nail with nail bouncing back and hitting right eye 20/20 R, L and bilateral

## 2020-02-28 NOTE — ED Provider Notes (Signed)
[facility]    CSN: [id number] Arrival date & time: 02/28/20  1420      History   Chief Complaint Chief Complaint  Patient presents with   Eye Injury    HPI Lee Clayton is a 34 y.o. male.   34 year old male comes in for right eye irritation after injury today. States was hammering nail when nail bounce back, hitting right eye. States had pain and eye watering when incident occurred, this has not resolved. Feels like eye is swollen without pain. Denies vision changes, photophobia. Denies contact lens/glasses use. Has not tried anything for the symptoms.      Past Medical History:  Diagnosis Date   Allergy    Asthma     There are no problems to display for this patient.   Past Surgical History:  Procedure Laterality Date   TONSILLECTOMY         Home Medications    Prior to Admission medications   Medication Sig Start Date End Date Taking? Authorizing Provider  ofloxacin (OCUFLOX) 0.3 % ophthalmic solution 1 drop in right eye every 2 hours for the first 2 days, then 4 times a day for the next 5 days 02/28/20   Belinda Fisher, PA-C    Family History Family History  Problem Relation Age of Onset   Healthy Mother    Healthy Father     Social History Social History   Tobacco Use   Smoking status: Current Every Day Smoker    Packs/day: 1.00    Types: Cigarettes   Smokeless tobacco: Never Used  Substance Use Topics   Alcohol use: Not Currently    Alcohol/week: 0.0 standard drinks   Drug use: Not on file     Allergies   Patient has no known allergies.   Review of Systems Review of Systems  Reason unable to perform ROS: See HPI as above.     Physical Exam Triage Vital Signs ED Triage Vitals  Enc Vitals Group     BP 02/28/20 1428 122/74     Pulse Rate 02/28/20 1428 86     Resp 02/28/20 1428 16     Temp 02/28/20 1428 (!) 97.2 F (36.2 C)     Temp src --      SpO2 02/28/20 1428 98 %     Weight --      Height --       Head Circumference --      Peak Flow --      Pain Score 02/28/20 1429 0     Pain Loc --      Pain Edu? --      Excl. in GC? --    No data found.  Updated Vital Signs BP 122/74    Pulse 86    Temp (!) 97.2 F (36.2 C)    Resp 16    SpO2 98%   Visual Acuity Right Eye Distance:   Left Eye Distance:   Bilateral Distance:    Right Eye Near:   20/20 ([place]) Left Eye Near:    20/20 ([place]) Bilateral Near:     Physical Exam Constitutional:      General: He is not in acute distress.    Appearance: He is well-developed.  HENT:     Head: Normocephalic and atraumatic.  Eyes:     General: Lids are normal. Lids are everted, no foreign bodies appreciated.     Extraocular Movements: Extraocular movements intact.  Pupils: Pupils are equal, round, and reactive to light.     Comments: Right conjunctival injection to the temporal aspect   Fluorescein stain with corneal abrasion to the 7 o'clock region.  Corneal abrasion approximately 1 cm in length, does not extend to the iris/pupil.  Negative Seidel sign  Musculoskeletal:     Cervical back: Normal range of motion and neck supple.  Skin:    General: Skin is warm and dry.  Neurological:     Mental Status: He is alert and oriented to person, place, and time.     UC Treatments / Results  Labs (all labs ordered are listed, but only abnormal results are displayed) Labs Reviewed - No data to display  EKG   Radiology No results found.  Procedures Procedures (including critical care time)  Medications Ordered in UC Medications - No data to display  Initial Impression / Assessment and Plan / UC Course  I have reviewed the triage vital signs and the nursing notes.  Pertinent labs & imaging results that were available during my care of the patient were reviewed by me and considered in my medical decision making (see chart for details).    Corneal abrasion that does not extend to the iris or pupil.  Negative Seidel sign.  Will start  ofloxacin as directed.  Other symptomatic treatment discussed.  Patient to follow-up with ophthalmology for recheck.  Otherwise return precautions given.  Final Clinical Impressions(s) / UC Diagnoses   Final diagnoses:  Abrasion of right cornea, initial encounter    ED Prescriptions    Medication Sig Dispense Auth. Provider   ofloxacin (OCUFLOX) 0.3 % ophthalmic solution 1 drop in right eye every 2 hours for the first 2 days, then 4 times a day for the next 5 days 5 mL Belinda Fisher, PA-C     PDMP not reviewed this encounter.   Belinda Fisher, PA-C 02/28/20 1447
